# Patient Record
Sex: Male | Born: 1960 | Race: White | Hispanic: No | Marital: Married | Smoking: Current every day smoker
Health system: Southern US, Community
[De-identification: ages and names within clinical notes are randomized; demographics above are authoritative.]

## PROBLEM LIST (undated history)

## (undated) DIAGNOSIS — N400 Enlarged prostate without lower urinary tract symptoms: Secondary | ICD-10-CM

## (undated) DIAGNOSIS — K219 Gastro-esophageal reflux disease without esophagitis: Secondary | ICD-10-CM

## (undated) DIAGNOSIS — R350 Frequency of micturition: Secondary | ICD-10-CM

## (undated) DIAGNOSIS — B1920 Unspecified viral hepatitis C without hepatic coma: Secondary | ICD-10-CM

## (undated) DIAGNOSIS — I10 Essential (primary) hypertension: Secondary | ICD-10-CM

## (undated) DIAGNOSIS — Z973 Presence of spectacles and contact lenses: Secondary | ICD-10-CM

## (undated) HISTORY — PX: TYMPANIC MEMBRANE REPAIR: SHX294

## (undated) HISTORY — PX: APPENDECTOMY: SHX54

## (undated) HISTORY — PX: COLONOSCOPY: SHX174

---

## 1998-11-01 ENCOUNTER — Ambulatory Visit (HOSPITAL_COMMUNITY): Admission: RE | Admit: 1998-11-01 | Discharge: 1998-11-01 | Payer: Self-pay | Admitting: Gastroenterology

## 1998-11-04 ENCOUNTER — Ambulatory Visit (HOSPITAL_COMMUNITY): Admission: RE | Admit: 1998-11-04 | Discharge: 1998-11-04 | Payer: Self-pay | Admitting: Gastroenterology

## 1998-11-04 ENCOUNTER — Encounter: Payer: Self-pay | Admitting: Gastroenterology

## 1998-11-15 ENCOUNTER — Ambulatory Visit (HOSPITAL_COMMUNITY): Admission: RE | Admit: 1998-11-15 | Discharge: 1998-11-15 | Payer: Self-pay | Admitting: Gastroenterology

## 1999-06-27 ENCOUNTER — Other Ambulatory Visit: Admission: RE | Admit: 1999-06-27 | Discharge: 1999-07-07 | Payer: Self-pay | Admitting: Psychiatry

## 1999-12-13 ENCOUNTER — Emergency Department (HOSPITAL_COMMUNITY): Admission: EM | Admit: 1999-12-13 | Discharge: 1999-12-13 | Payer: Self-pay | Admitting: Emergency Medicine

## 1999-12-13 ENCOUNTER — Encounter: Payer: Self-pay | Admitting: Emergency Medicine

## 2004-04-11 ENCOUNTER — Ambulatory Visit (HOSPITAL_COMMUNITY): Admission: RE | Admit: 2004-04-11 | Discharge: 2004-04-11 | Payer: Self-pay | Admitting: Family Medicine

## 2005-08-15 ENCOUNTER — Emergency Department (HOSPITAL_COMMUNITY): Admission: EM | Admit: 2005-08-15 | Discharge: 2005-08-15 | Payer: Self-pay | Admitting: Emergency Medicine

## 2008-06-07 ENCOUNTER — Observation Stay (HOSPITAL_COMMUNITY): Admission: EM | Admit: 2008-06-07 | Discharge: 2008-06-08 | Payer: Self-pay | Admitting: Emergency Medicine

## 2009-12-12 IMAGING — CR DG CHEST 1V PORT
1 series · 1 of 1 positions shown · non-contrast
Comparison: None available

CLINICAL DATA: Chest pain

PORTABLE CHEST - 1 VIEW

[view not recorded]
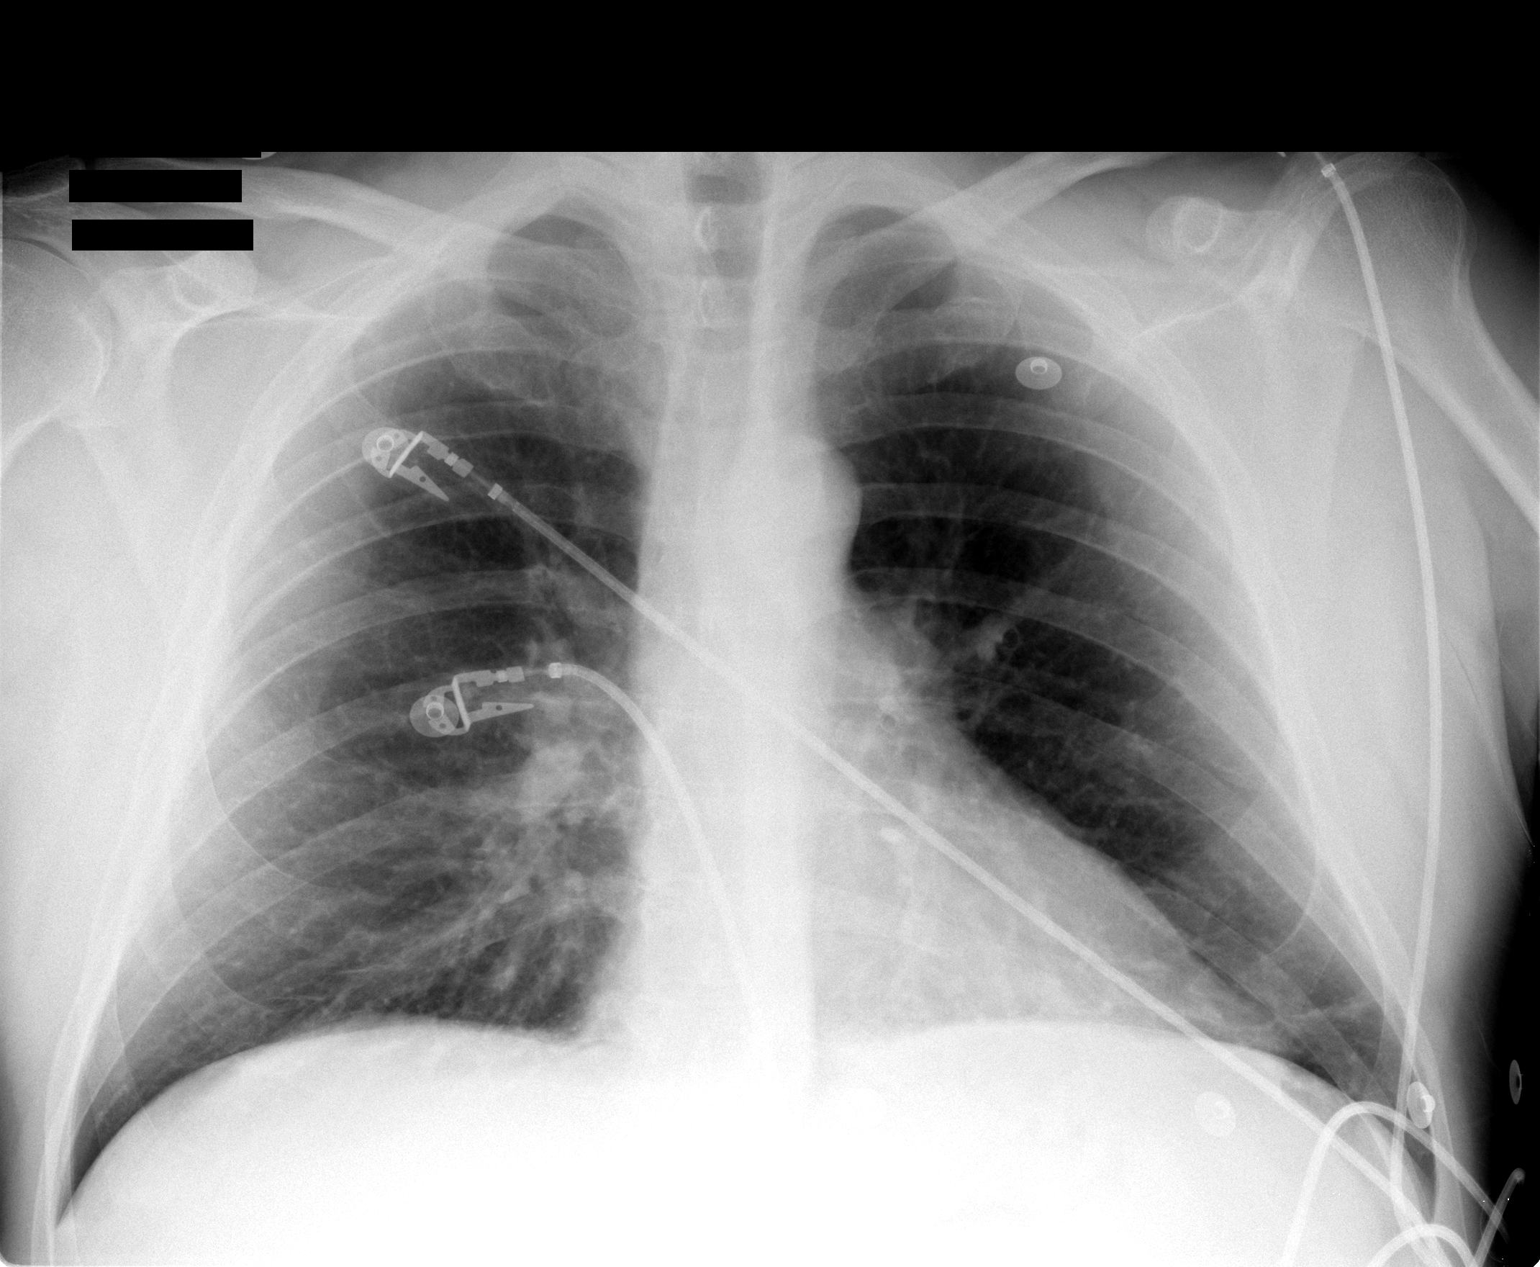

[1 of 1 positions shown; findings below may reference images not displayed]

FINDINGS: All minimal linear scarring or atelectasis laterally at
the left lung base.  Lungs otherwise clear.  Heart size normal.  No
effusion.  Visualized bones unremarkable.
IMPRESSION: 1.  Minimal linear scarring or atelectasis at the left lung base

## 2009-12-12 IMAGING — CT CT ANGIO CHEST
2 of 9 series · 12 of 36 positions shown · IV contrast (omnipaque)
Comparison: No comparison chest CT.  Prior abdominal CT 04/11/2004.

CLINICAL DATA: CHEST PAIN.  RULE OUT DISSECTION.

CT ANGIOGRAPHY CHEST
TECHNIQUE: Multidetector CT imaging of the chest was performed
using the standard protocol during bolus administration of
intravenous contrast. Multiplanar CT image reconstructions were
obtained to evaluate the vascular anatomy.
Contrast: 100 ml Omnipaque 350

[Series 5: aortic dissection · axial · 0.80mm/px · z∈[-524,-110]mm · 11 of 200 slices shown]
[im 17/200  lung]
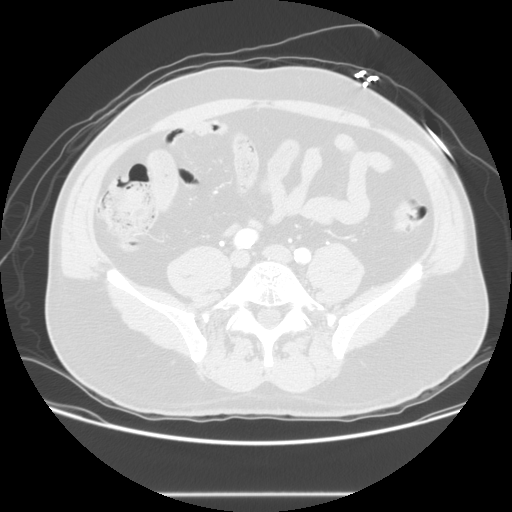
[im 34/200  mediastinal]
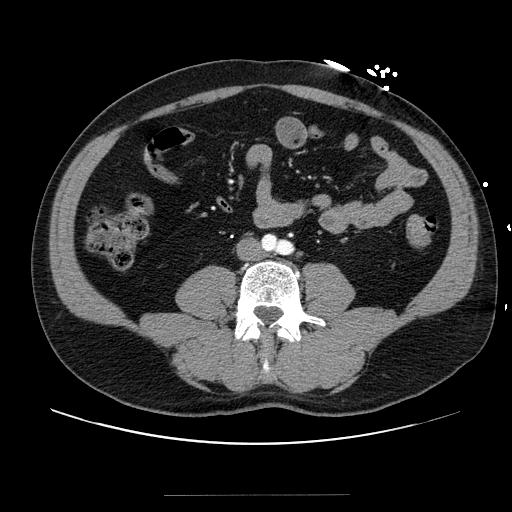
[im 50/200  lung]
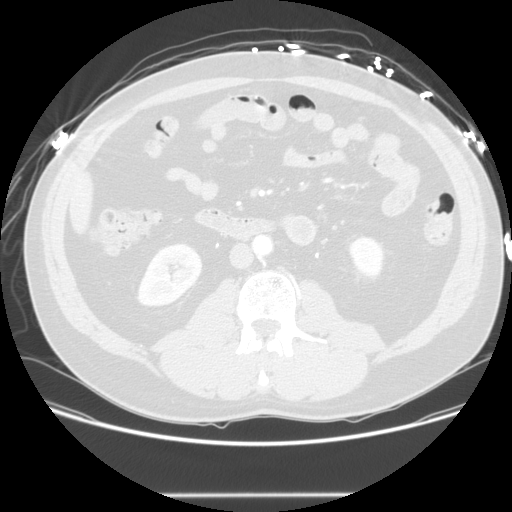
[im 67/200  mediastinal]
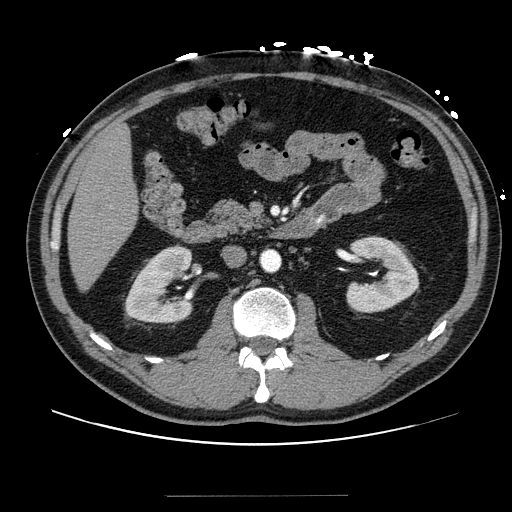
[im 83/200  lung]
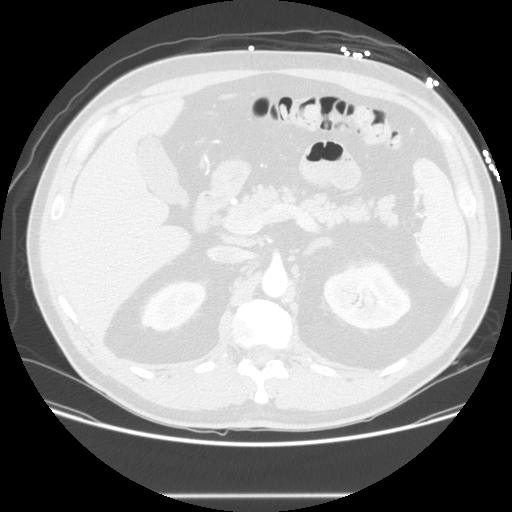
[im 100/200  mediastinal]
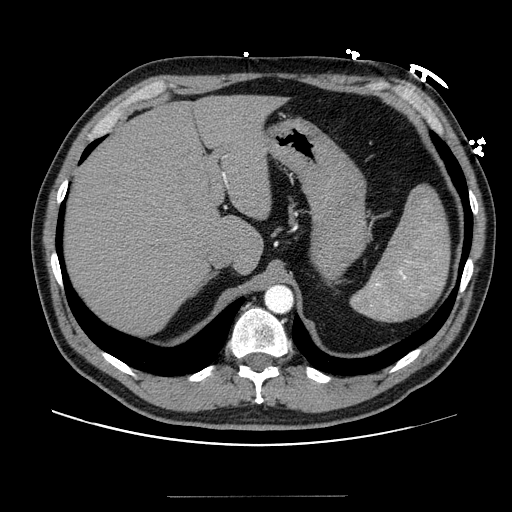
[im 117/200  lung]
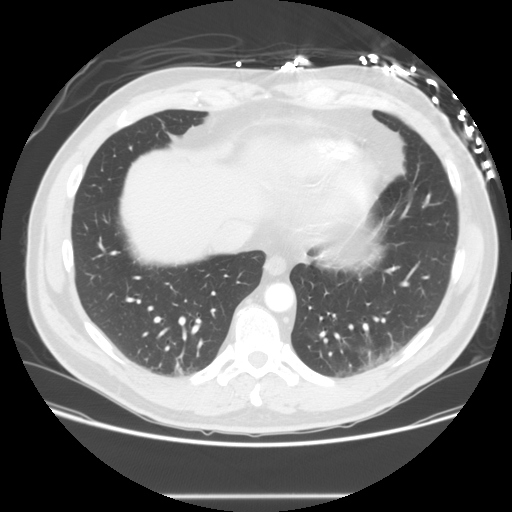
[im 133/200  mediastinal]
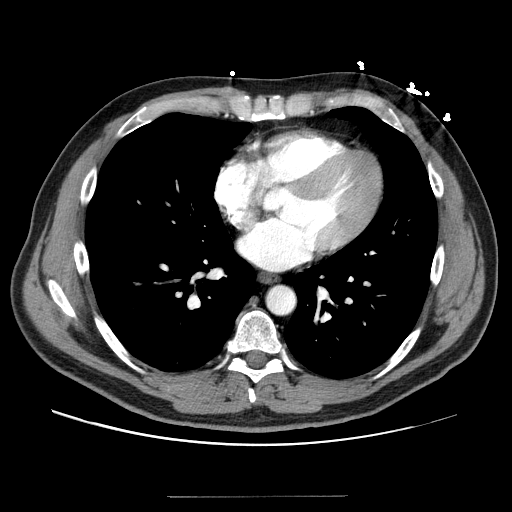
[im 150/200  lung]
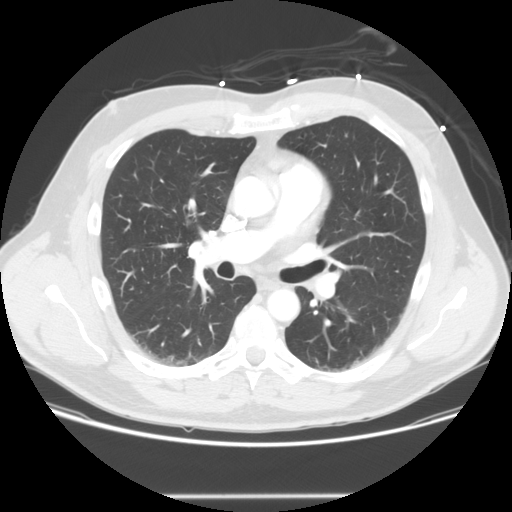
[im 166/200  mediastinal]
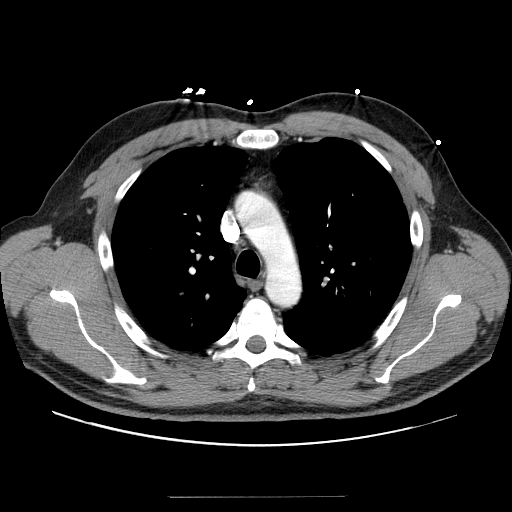
[im 183/200  lung]
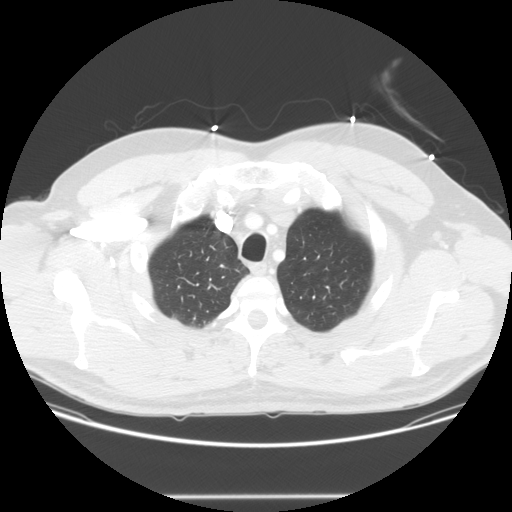

[Series 703: reformatted · coronal · 0.90mm/px · 1 of 112 slices shown]
[im 56/112  mediastinal]
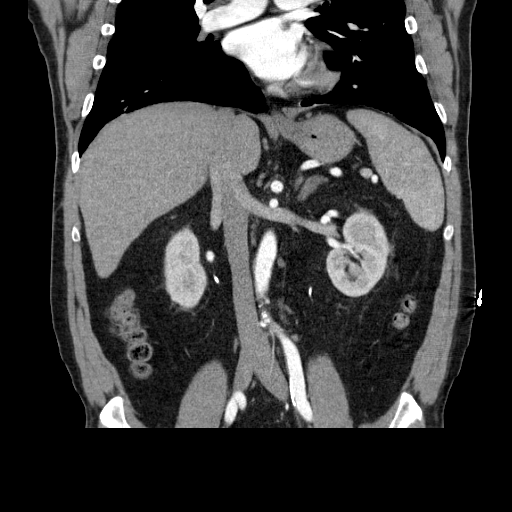

[12 of 36 positions shown; findings below may reference images not displayed]

FINDINGS: No aortic dissection.  Left vertebral artery arises
directly from the aortic arch.  No obvious pulmonary embolus.
Small calcification left anterior descending coronary artery.
Basilar subsegmental atelectatic changes.  No mediastinal or hilar
adenopathy.  Mild degenerative changes thoracic spine without bony
destructive lesion.  No pericardial effusion.
IMPRESSION: No aortic dissection.

Tiny calcification left anterior descending coronary artery.

Bibasilar subsegmental atelectatic changes.

CT angiogram of the abdomen:
FINDINGS: No abdominal aortic dissection.  Scattered plaque lower
abdominal aorta and common iliac arteries.  No aneurysmal
dilatation of the abdominal aorta..  Two dominant right renal
arteries.  No significant narrowing celiac axis or superior
mesenteric artery.

Arterial phase imaging of the liver, spleen, adrenal glands,
kidneys and pancreas unremarkable.  No calcified gallstones.  No
obvious upper abdominal abnormal fluid collection or inflammatory
process.  Mild degenerative changes
IMPRESSION: Mild atherosclerotic type changes lower abdominal aorta and common
iliac arteries without evidence of aneurysmal dilatation or
dissection.

## 2011-02-06 NOTE — Discharge Summary (Signed)
NAMELEANDREW, KEECH                   ACCOUNT NO.:  [id number]   MEDICAL RECORD NO.:  [id number]          PATIENT TYPE:  OBV   LOCATION:  2919                         FACILITY:  [facility]   PHYSICIAN:  Armanda Magic, M.D.     DATE OF BIRTH:  [birth year]   DATE OF ADMISSION:  06/07/2008  DATE OF DISCHARGE:  06/08/2008                               DISCHARGE SUMMARY   DISCHARGE DIAGNOSES:  1. Chest pain, atypical, felt to be noncardiac in nature.  2. Dyslipidemia with low HDL.  3. History of hepatitis C status post interferon.  4. Hypertension.   HOSPITAL COURSE:  Dean Davis is a 50 year old male patient who was  admitted to [facility] on June 07, 2008, with severe substernal  chest pain.   Lab studies showed normal cardiac isoenzymes.  Other lab studies showed  a total cholesterol of 165, triglycerides 185, HDL 30, and LDL 98.  A CT  angiogram of the chest showed no aneurysmal dilatation or dissection.   EKG showed normal sinus rhythm with no ST-T wave changes.   The patient was kept in the hospital overnight and underwent a nuclear  stress test.  The stress test showed no ischemia, EF 64%, essentially  normal.  The patient was discharged to home in stable but improved  condition.  The patient is to go home on the following medications:  1. Nexium 40 mg 1 tablet daily.  2. Bisoprolol/HCTZ 5/6.25 mg 1 tablet daily.   Remain on a low-sodium, heart-healthy diet.  No restrictions on  activity.  Follow with Dr. Carolanne Grumbling in 2 weeks.  Office will call  about appointment.      Guy Franco, P.A.      Armanda Magic, M.D.  Electronically Signed    LB/MEDQ  D:  06/22/2008  T:  06/23/2008  Job:  [id number]

## 2011-06-22 LAB — POCT CARDIAC MARKERS
CKMB, poc: 1
CKMB, poc: 1.3
Myoglobin, poc: 45.4
Myoglobin, poc: 45.9
Myoglobin, poc: 66.4

## 2011-06-22 LAB — DIFFERENTIAL
Basophils Relative: 0
Lymphs Abs: 3.3
Monocytes Relative: 5
Neutro Abs: 8.1 — ABNORMAL HIGH

## 2011-06-22 LAB — CK TOTAL AND CKMB (NOT AT ARMC)
CK, MB: 1.8
CK, MB: 1.8
Total CK: 86
Total CK: 97

## 2011-06-22 LAB — COMPREHENSIVE METABOLIC PANEL
AST: 28
Albumin: 4.1
Alkaline Phosphatase: 53
Creatinine, Ser: 0.99
GFR calc non Af Amer: >60
Glucose, Bld: 107 — ABNORMAL HIGH
Potassium: 4.5
Total Protein: 7.2

## 2011-06-22 LAB — CARDIAC PANEL(CRET KIN+CKTOT+MB+TROPI)
CK, MB: 1.6
Relative Index: INVALID

## 2011-06-22 LAB — CBC
Hemoglobin: 14.2
Hemoglobin: 16.9
MCHC: 33.3
MCV: 92.7
MCV: 93.4
RBC: 5.43
RDW: 14
WBC: 12.2 — ABNORMAL HIGH
WBC: 14.1 — ABNORMAL HIGH

## 2011-06-22 LAB — D-DIMER, QUANTITATIVE: D-Dimer, Quant: 0.43

## 2011-06-22 LAB — LIPID PANEL
Cholesterol: 165
HDL: 30 — ABNORMAL LOW
Triglycerides: 185 — ABNORMAL HIGH

## 2011-06-22 LAB — POCT I-STAT, CHEM 8
Creatinine, Ser: 1.1
Hemoglobin: 17.7 — ABNORMAL HIGH
Sodium: 141

## 2011-06-22 LAB — MAGNESIUM: Magnesium: 2.3

## 2011-06-22 LAB — HEPARIN LEVEL (UNFRACTIONATED)
Heparin Unfractionated: 0.66
Heparin Unfractionated: 0.9 — ABNORMAL HIGH

## 2011-06-22 LAB — TROPONIN I: Troponin I: <0.01

## 2011-06-22 LAB — APTT: aPTT: 30

## 2013-12-24 ENCOUNTER — Emergency Department (HOSPITAL_COMMUNITY)
Admission: EM | Admit: 2013-12-24 | Discharge: 2013-12-24 | Disposition: A | Discharge status: Home / Self Care | Payer: 59 | Attending: Emergency Medicine | Admitting: Emergency Medicine

## 2013-12-24 ENCOUNTER — Encounter (HOSPITAL_COMMUNITY): Payer: Self-pay | Admitting: Emergency Medicine

## 2013-12-24 DIAGNOSIS — R339 Retention of urine, unspecified: Secondary | ICD-10-CM | POA: Insufficient documentation

## 2013-12-24 DIAGNOSIS — I10 Essential (primary) hypertension: Secondary | ICD-10-CM | POA: Insufficient documentation

## 2013-12-24 DIAGNOSIS — Z8619 Personal history of other infectious and parasitic diseases: Secondary | ICD-10-CM | POA: Insufficient documentation

## 2013-12-24 DIAGNOSIS — Z79899 Other long term (current) drug therapy: Secondary | ICD-10-CM | POA: Insufficient documentation

## 2013-12-24 DIAGNOSIS — R3989 Other symptoms and signs involving the genitourinary system: Secondary | ICD-10-CM | POA: Insufficient documentation

## 2013-12-24 DIAGNOSIS — R350 Frequency of micturition: Secondary | ICD-10-CM | POA: Insufficient documentation

## 2013-12-24 DIAGNOSIS — F17200 Nicotine dependence, unspecified, uncomplicated: Secondary | ICD-10-CM | POA: Insufficient documentation

## 2013-12-24 DIAGNOSIS — Z87448 Personal history of other diseases of urinary system: Secondary | ICD-10-CM | POA: Insufficient documentation

## 2013-12-24 DIAGNOSIS — R3915 Urgency of urination: Secondary | ICD-10-CM | POA: Insufficient documentation

## 2013-12-24 DIAGNOSIS — R109 Unspecified abdominal pain: Secondary | ICD-10-CM | POA: Insufficient documentation

## 2013-12-24 HISTORY — DX: Essential (primary) hypertension: I10

## 2013-12-24 HISTORY — DX: Unspecified viral hepatitis C without hepatic coma: B19.20

## 2013-12-24 LAB — URINALYSIS, ROUTINE W REFLEX MICROSCOPIC
Bilirubin Urine: NEGATIVE
Glucose, UA: NEGATIVE mg/dL
Hgb urine dipstick: NEGATIVE
Ketones, ur: NEGATIVE mg/dL
LEUKOCYTES UA: NEGATIVE
NITRITE: NEGATIVE
PH: 7 (ref 5.0–8.0)
Protein, ur: NEGATIVE mg/dL
SPECIFIC GRAVITY, URINE: 1.012 (ref 1.005–1.030)
Urobilinogen, UA: 0.2 mg/dL (ref 0.0–1.0)

## 2013-12-24 LAB — COMPREHENSIVE METABOLIC PANEL
ALT: 94 U/L — ABNORMAL HIGH (ref 0–53)
AST: 54 U/L — ABNORMAL HIGH (ref 0–37)
Albumin: 3.9 g/dL (ref 3.5–5.2)
Alkaline Phosphatase: 66 U/L (ref 39–117)
BUN: 14 mg/dL (ref 6–23)
CALCIUM: 9.6 mg/dL (ref 8.4–10.5)
CO2: 21 mEq/L (ref 19–32)
Chloride: 101 mEq/L (ref 96–112)
Creatinine, Ser: 0.96 mg/dL (ref 0.50–1.35)
GFR calc non Af Amer: >90 mL/min (ref >90)
GLUCOSE: 129 mg/dL — AB (ref 70–99)
Potassium: 4.3 mEq/L (ref 3.7–5.3)
SODIUM: 138 meq/L (ref 137–147)
TOTAL PROTEIN: 7.5 g/dL (ref 6.0–8.3)
Total Bilirubin: 0.4 mg/dL (ref 0.3–1.2)

## 2013-12-24 LAB — CBC WITH DIFFERENTIAL/PLATELET
BASOS PCT: 0 % (ref 0–1)
Basophils Absolute: 0 10*3/uL (ref 0.0–0.1)
EOS ABS: 0.1 10*3/uL (ref 0.0–0.7)
EOS PCT: 1 % (ref 0–5)
HCT: 45.8 % (ref 39.0–52.0)
Hemoglobin: 15.5 g/dL (ref 13.0–17.0)
LYMPHS ABS: 1.8 10*3/uL (ref 0.7–4.0)
Lymphocytes Relative: 20 % (ref 12–46)
MCH: 30.9 pg (ref 26.0–34.0)
MCHC: 33.8 g/dL (ref 30.0–36.0)
MCV: 91.2 fL (ref 78.0–100.0)
MONOS PCT: 7 % (ref 3–12)
Monocytes Absolute: 0.7 10*3/uL (ref 0.1–1.0)
Neutro Abs: 6.4 10*3/uL (ref 1.7–7.7)
Neutrophils Relative %: 71 % (ref 43–77)
PLATELETS: 204 10*3/uL (ref 150–400)
RBC: 5.02 MIL/uL (ref 4.22–5.81)
RDW: 14.7 % (ref 11.5–15.5)
WBC: 8.9 10*3/uL (ref 4.0–10.5)

## 2013-12-24 MED ORDER — TAMSULOSIN HCL 0.4 MG PO CAPS: 0.4000 mg | ORAL_CAPSULE | Freq: Every day | ORAL | Status: AC

## 2013-12-24 MED ORDER — LIDOCAINE HCL 2 % EX GEL
CUTANEOUS | Status: AC
  Administered 2013-12-24: 10
  Filled 2013-12-24: qty 10

## 2013-12-24 NOTE — ED Notes (Signed)
Pt from home via EMS c/o sudden onset bladder pain that radiates to groin. Pt states that when he tries to urinate, it's "only a trickle." Pt received 200 mcg Fentanyl en route w/o relief. Pt rates 10/10 pain. Pt is A&O and in NAD

## 2013-12-24 NOTE — Discharge Instructions (Signed)
Acute Urinary Retention, Male Follow up with the urologist this week. Return to the ED if you develop new or worsening symptoms. Acute urinary retention is the temporary inability to urinate. This is a common problem in older men. As men age their prostates become larger and block the flow of urine from the bladder. This is usually a problem that has come on gradually.  HOME CARE INSTRUCTIONS If you are sent home with a Foley catheter and a drainage system, you will need to discuss the best course of action with your health care provider. While the catheter is in, maintain a good intake of fluids. Keep the drainage bag emptied and lower than your catheter. This is so that contaminated urine will not flow back into your bladder, which could lead to a urinary tract infection. There are two main types of drainage bags. One is a large bag that usually is used at night. It has a good capacity that will allow you to sleep through the night without having to empty it. The second type is called a leg bag. It has a smaller capacity, so it needs to be emptied more frequently. However, the main advantage is that it can be attached by a leg strap and can go underneath your clothing, allowing you the freedom to move about or leave your home. Only take over-the-counter or prescription medicines for pain, discomfort, or fever as directed by your health care provider.  SEEK MEDICAL CARE IF:  You develop a low-grade fever.  You experience spasms or leakage of urine with the spasms. SEEK IMMEDIATE MEDICAL CARE IF:   You develop chills or fever.  Your catheter stops draining urine.  Your catheter falls out.  You start to develop increased bleeding that does not respond to rest and increased fluid intake. MAKE SURE YOU:  Understand these instructions.  Will watch your condition.  Will get help right away if you are not doing well or get worse. Document Released: 12/14/2000 Document Revised: 05/10/2013  Document Reviewed: 02/16/2013 [facility] Patient Information 2014 [employer] [place].

## 2013-12-24 NOTE — ED Provider Notes (Signed)
CSN: [id number]     Arrival date & time 12/24/13  1338 History   First MD Initiated Contact with Patient 12/24/13 1403     Chief Complaint  Patient presents with  . Urinary Retention     (Consider location/radiation/quality/duration/timing/severity/associated sxs/prior Treatment) HPI Comments: Patient extremely uncomfortable complaining of inability to urinate since last night. No previous history of same. Patient says he often has urinary frequency and urgency and thinks he enlarged prostate. Denies any fever, nausea or vomiting. No chest pain or shortness of breath. He received fentanyl by EMS without relief.  The history is provided by the patient and the EMS personnel. The history is limited by the condition of the patient.    Past Medical History  Diagnosis Date  . Hypertension   . Hepatitis C    Past Surgical History  Procedure Laterality Date  . Appendectomy     No family history on file. History  Substance Use Topics  . Smoking status: Current Every Day Smoker -- 1.00 packs/day    Types: Cigarettes  . Smokeless tobacco: Not on file  . Alcohol Use: Yes     Comment: socially    Review of Systems  Constitutional: Negative for activity change and appetite change.  HENT: Negative for congestion.   Respiratory: Negative for chest tightness.   Cardiovascular: Negative for chest pain.  Gastrointestinal: Positive for abdominal pain. Negative for nausea and vomiting.  Genitourinary: Positive for urgency, frequency and difficulty urinating. Negative for flank pain.  Musculoskeletal: Negative for back pain.  Skin: Negative for rash.  Neurological: Negative for dizziness, weakness and headaches.  A complete 10 system review of systems was obtained and all systems are negative except as noted in the HPI and PMH.      Allergies  Review of patient's allergies indicates no known allergies.  Home Medications   Current Outpatient Rx  Name  Route  Sig  Dispense  Refill  .  amLODipine (NORVASC) 5 MG tablet   Oral   Take 5 mg by mouth at bedtime.         . diphenhydrAMINE (SOMINEX) 25 MG tablet   Oral   Take 25 mg by mouth at bedtime as needed for sleep.         . tamsulosin (FLOMAX) 0.4 MG CAPS capsule   Oral   Take 1 capsule (0.4 mg total) by mouth daily.   30 capsule   0    BP 118/77  Pulse 93  Temp(Src) 97.5 F (36.4 C) (Oral)  Resp 18  SpO2 100% Physical Exam  Constitutional: He is oriented to person, place, and time. He appears well-developed and well-nourished. He appears distressed.  uncomfortable  HENT:  Head: Normocephalic and atraumatic.  Right Ear: External ear normal.  Left Ear: External ear normal.  Mouth/Throat: Oropharynx is clear and moist. No oropharyngeal exudate.  Eyes: Conjunctivae and EOM are normal. Pupils are equal, round, and reactive to light.  Neck: Normal range of motion. Neck supple.  Cardiovascular: Normal rate, regular rhythm and normal heart sounds.   No murmur heard. Pulmonary/Chest: Effort normal and breath sounds normal. No respiratory distress.  Abdominal: Soft. There is tenderness. There is no rebound and no guarding.  Enlarged tender bladder  Genitourinary:  No testicular tenderness  Musculoskeletal: Normal range of motion. He exhibits no edema and no tenderness.  Neurological: He is alert and oriented to person, place, and time. No cranial nerve deficit. He exhibits normal muscle tone. Coordination normal.  Skin: Skin is  warm.    ED Course  Procedures (including critical care time) Labs Review Labs Reviewed  COMPREHENSIVE METABOLIC PANEL - Abnormal; Notable for the following:    Glucose, Bld 129 (*)    AST 54 (*)    ALT 94 (*)    All other components within normal limits  CBC WITH DIFFERENTIAL  URINALYSIS, ROUTINE W REFLEX MICROSCOPIC   Imaging Review No results found.   EKG Interpretation None      MDM   Final diagnoses:  Urinary retention   Difficulty urinating and bladder  pain since this morning. History of self diagnosed prostate problems in the past. No nausea, vomiting or fever.  Foley catheter placed with 700 cc of clear urine drained. Patient feels 100% better. No abdominal pain. Creatinine wnl.  Mild LFT elevation with history of hepatitis C.  No RUQ pain.  Urinalysis is negative. Will start Flomax for perceived prostate issues. Followup with urology this week. Return precautions discussed.  Glynn OctaveStephen Hagop Mccollam, MD 12/24/13 1534

## 2013-12-24 NOTE — ED Notes (Signed)
Pt bladder scan results >200 ml

## 2013-12-24 NOTE — ED Notes (Signed)
Pt from home c/o "extreme pressure and pain in my bladder." Pt denies that this has happened before, but states that he has had urinary frequency and urgency for years and has self-diagnosed himself with enlarged prostate. Pt denies N/V/D, fever. Pt lung fields clear and reports 10/10 pain. Pt is A&O and in NAD

## 2013-12-24 NOTE — ED Notes (Signed)
Bed: [id number] Expected date: 12/24/13 Expected time:  Means of arrival:  Comments: EMS-Difficulty Urinating

## 2014-09-06 ENCOUNTER — Encounter (HOSPITAL_BASED_OUTPATIENT_CLINIC_OR_DEPARTMENT_OTHER): Payer: Self-pay | Admitting: *Deleted

## 2014-09-06 NOTE — Progress Notes (Signed)
   09/06/14 1435  OBSTRUCTIVE SLEEP APNEA  Have you ever been diagnosed with sleep apnea through a sleep study? No  Do you snore loudly (loud enough to be heard through closed doors)?  1  Do you often feel tired, fatigued, or sleepy during the daytime? 0  Has anyone observed you stop breathing during your sleep? 0  Do you have, or are you being treated for high blood pressure? 1  BMI more than 35 kg/m2? 0  Age over 54 years old? 1  Gender: 1  Obstructive Sleep Apnea Score 4  Score 4 or greater  Results sent to PCP   

## 2014-09-06 NOTE — Progress Notes (Signed)
To come in for ekg-bmet Sees different dr-did see urologist after er visit 4/15 for retention=-now on flomax Did see dr Tenny Crawross 09 for cp-stress test negative-started on acid reflux meds

## 2014-09-06 NOTE — Progress Notes (Signed)
   09/06/14 1435  OBSTRUCTIVE SLEEP APNEA  Have you ever been diagnosed with sleep apnea through a sleep study? No  Do you snore loudly (loud enough to be heard through closed doors)?  1  Do you often feel tired, fatigued, or sleepy during the daytime? 0  Has anyone observed you stop breathing during your sleep? 0  Do you have, or are you being treated for high blood pressure? 1  BMI more than 35 kg/m2? 0  Age over 54 years old? 1  Gender: 1  Obstructive Sleep Apnea Score 4  Score 4 or greater  Results sent to PCP

## 2014-09-07 ENCOUNTER — Encounter (HOSPITAL_BASED_OUTPATIENT_CLINIC_OR_DEPARTMENT_OTHER)
Admission: RE | Admit: 2014-09-07 | Discharge: 2014-09-07 | Disposition: A | Discharge status: Home / Self Care | Payer: 59 | Source: Ambulatory Visit | Attending: Orthopedic Surgery | Admitting: Orthopedic Surgery

## 2014-09-07 DIAGNOSIS — I10 Essential (primary) hypertension: Secondary | ICD-10-CM | POA: Diagnosis not present

## 2014-09-07 DIAGNOSIS — Z01818 Encounter for other preprocedural examination: Secondary | ICD-10-CM | POA: Insufficient documentation

## 2014-09-07 DIAGNOSIS — S8262XA Displaced fracture of lateral malleolus of left fibula, initial encounter for closed fracture: Secondary | ICD-10-CM | POA: Diagnosis present

## 2014-09-07 DIAGNOSIS — K219 Gastro-esophageal reflux disease without esophagitis: Secondary | ICD-10-CM | POA: Diagnosis not present

## 2014-09-07 DIAGNOSIS — B1920 Unspecified viral hepatitis C without hepatic coma: Secondary | ICD-10-CM | POA: Diagnosis not present

## 2014-09-07 DIAGNOSIS — Y939 Activity, unspecified: Secondary | ICD-10-CM | POA: Diagnosis not present

## 2014-09-07 DIAGNOSIS — X58XXXA Exposure to other specified factors, initial encounter: Secondary | ICD-10-CM | POA: Diagnosis not present

## 2014-09-07 DIAGNOSIS — Y999 Unspecified external cause status: Secondary | ICD-10-CM | POA: Diagnosis not present

## 2014-09-07 DIAGNOSIS — Y929 Unspecified place or not applicable: Secondary | ICD-10-CM | POA: Diagnosis not present

## 2014-09-07 DIAGNOSIS — F17200 Nicotine dependence, unspecified, uncomplicated: Secondary | ICD-10-CM | POA: Diagnosis not present

## 2014-09-07 LAB — BASIC METABOLIC PANEL
ANION GAP: 13 (ref 5–15)
BUN: 19 mg/dL (ref 6–23)
CHLORIDE: 101 meq/L (ref 96–112)
CO2: 23 mEq/L (ref 19–32)
CREATININE: 0.8 mg/dL (ref 0.50–1.35)
Calcium: 9.2 mg/dL (ref 8.4–10.5)
GFR calc non Af Amer: >90 mL/min (ref >90)
Glucose, Bld: 86 mg/dL (ref 70–99)
POTASSIUM: 4.5 meq/L (ref 3.7–5.3)
SODIUM: 137 meq/L (ref 137–147)

## 2014-09-07 NOTE — Progress Notes (Signed)
ekg reviewed by dr Jacklynn Buemassagee no change from last

## 2014-09-07 NOTE — H&P (Signed)
  [facility] [address]   [address] [place], [place] [phone] A Division [facility]  Loreta Aveaniel F. Archibald Marchetta, M.D.   Robert A. Thurston HoleWainer, M.D.   Burnell BlanksW. Dan Caffrey, M.D.   Eulas PostJoshua P. Landau, M.D.   Lunette StandsAnna Voytek, M.D Jewel Baizeimothy D. Eulah PontMurphy, M.D.  Buford DresserWesley R. Ibazebo, M.D.  Estell HarpinJames S. Kramer, M.D.    Melina Fiddlerebecca S. Bassett, M.D. Mary L. Isidoro DonningAnton, PA-C  Kirstin A. Shepperson, PA-C  Josh Gilbertshadwell, PA-C [place]andon Parry, [place]A-C  RE: Dean FuseMahl, Dean   [phone]      DOB: 06/21/1961 PROGRESS NOTE: 09-03-14 Dean Davis is a 53 year old seen for follow-up from his left ankle distal fibula displaced fracture with significant post fracture swelling. He was scheduled for surgery in 2 days and we needed to check him today because of his swelling.  EXAMINATION: Well-developed well-nourished white male in no acute distress. Alert and oriented. Examination of his left ankle reveals a large amount of swelling, there is significant erythema and ecchymosis medially. He has a small wound anteromedially that is healing he has significant ecchymosis proximally as well.    X-RAYS: Left ankle AP lateral and oblique show persistence of the distal fibula fracture with medial joint space widening.  IMPRESSION: 10 days status post left ankle displaced distal fibula oblique fracture.  PLAN: At this point we will hold off on surgery. I'm going to have him see Dr. Renaye Rakersim Lejla Moeser in 4 days for wound check and have Dr. Eulah PontMurphy proceed with open reduction internal fixation of the ankle next week. We will set him up for this. We also place him on Keflex 500 mg. q.i.d.   Robert A. Thurston HoleWainer, M.D.   Electronically verified by Elana Almobert A. Thurston HoleWainer, M.D. RAW:kah D 09-03-14 T 09-04-14

## 2014-09-11 ENCOUNTER — Ambulatory Visit (HOSPITAL_BASED_OUTPATIENT_CLINIC_OR_DEPARTMENT_OTHER): Payer: 59 | Admitting: Anesthesiology

## 2014-09-11 ENCOUNTER — Encounter (HOSPITAL_BASED_OUTPATIENT_CLINIC_OR_DEPARTMENT_OTHER): Payer: Self-pay

## 2014-09-11 ENCOUNTER — Ambulatory Visit (HOSPITAL_BASED_OUTPATIENT_CLINIC_OR_DEPARTMENT_OTHER)
Admission: RE | Admit: 2014-09-11 | Discharge: 2014-09-11 | Disposition: A | Discharge status: Home / Self Care | Payer: 59 | Source: Ambulatory Visit | Attending: Orthopedic Surgery | Admitting: Orthopedic Surgery

## 2014-09-11 ENCOUNTER — Encounter (HOSPITAL_BASED_OUTPATIENT_CLINIC_OR_DEPARTMENT_OTHER)
Admission: RE | Disposition: A | Discharge status: Home / Self Care | Payer: Self-pay | Source: Ambulatory Visit | Attending: Orthopedic Surgery

## 2014-09-11 DIAGNOSIS — Y929 Unspecified place or not applicable: Secondary | ICD-10-CM | POA: Insufficient documentation

## 2014-09-11 DIAGNOSIS — S8262XA Displaced fracture of lateral malleolus of left fibula, initial encounter for closed fracture: Secondary | ICD-10-CM | POA: Insufficient documentation

## 2014-09-11 DIAGNOSIS — K219 Gastro-esophageal reflux disease without esophagitis: Secondary | ICD-10-CM | POA: Insufficient documentation

## 2014-09-11 DIAGNOSIS — F17200 Nicotine dependence, unspecified, uncomplicated: Secondary | ICD-10-CM | POA: Insufficient documentation

## 2014-09-11 DIAGNOSIS — B1920 Unspecified viral hepatitis C without hepatic coma: Secondary | ICD-10-CM | POA: Insufficient documentation

## 2014-09-11 DIAGNOSIS — X58XXXA Exposure to other specified factors, initial encounter: Secondary | ICD-10-CM | POA: Insufficient documentation

## 2014-09-11 DIAGNOSIS — I10 Essential (primary) hypertension: Secondary | ICD-10-CM | POA: Insufficient documentation

## 2014-09-11 DIAGNOSIS — Y999 Unspecified external cause status: Secondary | ICD-10-CM | POA: Insufficient documentation

## 2014-09-11 DIAGNOSIS — Y939 Activity, unspecified: Secondary | ICD-10-CM | POA: Insufficient documentation

## 2014-09-11 HISTORY — DX: Benign prostatic hyperplasia without lower urinary tract symptoms: N40.0

## 2014-09-11 HISTORY — DX: Presence of spectacles and contact lenses: Z97.3

## 2014-09-11 HISTORY — PX: ORIF FIBULA FRACTURE: SHX5114

## 2014-09-11 HISTORY — DX: Gastro-esophageal reflux disease without esophagitis: K21.9

## 2014-09-11 HISTORY — DX: Frequency of micturition: R35.0

## 2014-09-11 LAB — POCT HEMOGLOBIN-HEMACUE: Hemoglobin: 15.5 g/dL (ref 13.0–17.0)

## 2014-09-11 SURGERY — OPEN REDUCTION INTERNAL FIXATION (ORIF) FIBULA FRACTURE
Anesthesia: General | Laterality: Left

## 2014-09-11 MED ORDER — DEXTROSE-NACL 5-0.45 % IV SOLN: 100.0000 mL/h | INTRAVENOUS | Status: DC

## 2014-09-11 MED ORDER — ACETAMINOPHEN 500 MG PO TABS: 1000.0000 mg | ORAL_TABLET | Freq: Once | ORAL | Status: DC

## 2014-09-11 MED ORDER — FENTANYL CITRATE 0.05 MG/ML IJ SOLN
50.0000 ug | INTRAMUSCULAR | Status: DC | PRN
  Administered 2014-09-11 (×2): 100 ug via INTRAVENOUS

## 2014-09-11 MED ORDER — ONDANSETRON HCL 4 MG PO TABS: 4.0000 mg | ORAL_TABLET | Freq: Three times a day (TID) | ORAL | Status: AC | PRN

## 2014-09-11 MED ORDER — LACTATED RINGERS IV SOLN
INTRAVENOUS | Status: DC
  Administered 2014-09-11 (×2): via INTRAVENOUS

## 2014-09-11 MED ORDER — OXYCODONE-ACETAMINOPHEN 5-325 MG PO TABS: 2.0000 | ORAL_TABLET | ORAL | Status: AC | PRN

## 2014-09-11 MED ORDER — BUPIVACAINE-EPINEPHRINE (PF) 0.5% -1:200000 IJ SOLN
INTRAMUSCULAR | Status: DC | PRN
  Administered 2014-09-11: 20 mL via PERINEURAL

## 2014-09-11 MED ORDER — CEFAZOLIN SODIUM-DEXTROSE 2-3 GM-% IV SOLR
2.0000 g | INTRAVENOUS | Status: AC
  Administered 2014-09-11: 2 g via INTRAVENOUS

## 2014-09-11 MED ORDER — 0.9 % SODIUM CHLORIDE (POUR BTL) OPTIME
TOPICAL | Status: DC | PRN
  Administered 2014-09-11: 200 mL

## 2014-09-11 MED ORDER — FENTANYL CITRATE 0.05 MG/ML IJ SOLN
INTRAMUSCULAR | Status: AC
  Filled 2014-09-11: qty 2

## 2014-09-11 MED ORDER — FENTANYL CITRATE 0.05 MG/ML IJ SOLN
INTRAMUSCULAR | Status: DC | PRN
  Administered 2014-09-11 (×2): 50 ug via INTRAVENOUS

## 2014-09-11 MED ORDER — ROPIVACAINE HCL 5 MG/ML IJ SOLN
INTRAMUSCULAR | Status: DC | PRN
  Administered 2014-09-11: 20 mL via PERINEURAL

## 2014-09-11 MED ORDER — DOCUSATE SODIUM 100 MG PO CAPS: 100.0000 mg | ORAL_CAPSULE | Freq: Two times a day (BID) | ORAL | Status: AC

## 2014-09-11 MED ORDER — ONDANSETRON HCL 4 MG/2ML IJ SOLN
INTRAMUSCULAR | Status: DC | PRN
  Administered 2014-09-11: 4 mg via INTRAVENOUS

## 2014-09-11 MED ORDER — MIDAZOLAM HCL 2 MG/2ML IJ SOLN
INTRAMUSCULAR | Status: AC
  Filled 2014-09-11: qty 2

## 2014-09-11 MED ORDER — CEFAZOLIN SODIUM-DEXTROSE 2-3 GM-% IV SOLR
INTRAVENOUS | Status: AC
  Filled 2014-09-11: qty 50

## 2014-09-11 MED ORDER — LIDOCAINE HCL (CARDIAC) 20 MG/ML IV SOLN
INTRAVENOUS | Status: DC | PRN
  Administered 2014-09-11: 80 mg via INTRAVENOUS

## 2014-09-11 MED ORDER — ONDANSETRON HCL 4 MG/2ML IJ SOLN: 4.0000 mg | Freq: Once | INTRAMUSCULAR | Status: DC | PRN

## 2014-09-11 MED ORDER — PROPOFOL 10 MG/ML IV BOLUS
INTRAVENOUS | Status: DC | PRN
  Administered 2014-09-11: 300 mg via INTRAVENOUS

## 2014-09-11 MED ORDER — MIDAZOLAM HCL 2 MG/2ML IJ SOLN
1.0000 mg | INTRAMUSCULAR | Status: DC | PRN
  Administered 2014-09-11 (×2): 2 mg via INTRAVENOUS

## 2014-09-11 MED ORDER — ASPIRIN 81 MG PO TABS: 81.0000 mg | ORAL_TABLET | Freq: Every day | ORAL | Status: AC

## 2014-09-11 MED ORDER — HYDROMORPHONE HCL 1 MG/ML IJ SOLN
0.2500 mg | INTRAMUSCULAR | Status: DC | PRN
  Administered 2014-09-11 (×2): 0.5 mg via INTRAVENOUS

## 2014-09-11 MED ORDER — DEXAMETHASONE SODIUM PHOSPHATE 10 MG/ML IJ SOLN
INTRAMUSCULAR | Status: DC | PRN
  Administered 2014-09-11: 10 mg via INTRAVENOUS

## 2014-09-11 MED ORDER — HYDROMORPHONE HCL 1 MG/ML IJ SOLN
INTRAMUSCULAR | Status: AC
  Filled 2014-09-11: qty 1

## 2014-09-11 SURGICAL SUPPLY — 75 items
BANDAGE ELASTIC 4 VELCRO ST LF (GAUZE/BANDAGES/DRESSINGS) ×3 IMPLANT
BANDAGE ELASTIC 6 VELCRO ST LF (GAUZE/BANDAGES/DRESSINGS) ×3 IMPLANT
BANDAGE ESMARK 6X9 LF (GAUZE/BANDAGES/DRESSINGS) ×1 IMPLANT
BIT DRILL 2.5X125 (BIT) ×2 IMPLANT
BIT DRILL 3.5X125 (BIT) IMPLANT
BLADE SURG 15 STRL LF DISP TIS (BLADE) ×2 IMPLANT
BLADE SURG 15 STRL SS (BLADE) ×3
BNDG CMPR 9X6 STRL LF SNTH (GAUZE/BANDAGES/DRESSINGS) ×1
BNDG COHESIVE 4X5 TAN STRL (GAUZE/BANDAGES/DRESSINGS) ×2 IMPLANT
BNDG ESMARK 6X9 LF (GAUZE/BANDAGES/DRESSINGS) ×3
CHLORAPREP W/TINT 26ML (MISCELLANEOUS) ×3 IMPLANT
CLOSURE STERI-STRIP 1/2X4 (GAUZE/BANDAGES/DRESSINGS) ×1
CLSR STERI-STRIP ANTIMIC 1/2X4 (GAUZE/BANDAGES/DRESSINGS) ×2 IMPLANT
COVER BACK TABLE 60X90IN (DRAPES) ×3 IMPLANT
COVER MAYO STAND STRL (DRAPES) ×3 IMPLANT
CUFF TOURNIQUET SINGLE 24IN (TOURNIQUET CUFF) ×2 IMPLANT
CUFF TOURNIQUET SINGLE 34IN LL (TOURNIQUET CUFF) IMPLANT
DECANTER SPIKE VIAL GLASS SM (MISCELLANEOUS) IMPLANT
DRAPE C-ARM 42X72 X-RAY (DRAPES) IMPLANT
DRAPE C-ARMOR (DRAPES) IMPLANT
DRAPE EXTREMITY T 121X128X90 (DRAPE) ×3 IMPLANT
DRAPE OEC MINIVIEW 54X84 (DRAPES) ×3 IMPLANT
DRAPE U 20/CS (DRAPES) ×3 IMPLANT
DRAPE U-SHAPE 47X51 STRL (DRAPES) ×3 IMPLANT
DRILL BIT 3.5X125 (BIT) ×3
DRSG EMULSION OIL 3X3 NADH (GAUZE/BANDAGES/DRESSINGS) ×3 IMPLANT
ELECT REM PT RETURN 9FT ADLT (ELECTROSURGICAL) ×3
ELECTRODE REM PT RTRN 9FT ADLT (ELECTROSURGICAL) ×1 IMPLANT
GAUZE SPONGE 4X4 12PLY STRL (GAUZE/BANDAGES/DRESSINGS) ×3 IMPLANT
GLOVE BIO SURGEON STRL SZ 6.5 (GLOVE) ×1 IMPLANT
GLOVE BIO SURGEON STRL SZ7.5 (GLOVE) ×3 IMPLANT
GLOVE BIO SURGEON STRL SZ8 (GLOVE) ×2 IMPLANT
GLOVE BIO SURGEONS STRL SZ 6.5 (GLOVE) ×1
GLOVE BIOGEL PI IND STRL 7.0 (GLOVE) IMPLANT
GLOVE BIOGEL PI IND STRL 8 (GLOVE) ×1 IMPLANT
GLOVE BIOGEL PI INDICATOR 7.0 (GLOVE) ×2
GLOVE BIOGEL PI INDICATOR 8 (GLOVE) ×4
GLOVE EXAM NITRILE MD LF STRL (GLOVE) ×2 IMPLANT
GOWN STRL REUS W/ TWL LRG LVL3 (GOWN DISPOSABLE) ×4 IMPLANT
GOWN STRL REUS W/ TWL XL LVL3 (GOWN DISPOSABLE) ×1 IMPLANT
GOWN STRL REUS W/TWL LRG LVL3 (GOWN DISPOSABLE) ×6
GOWN STRL REUS W/TWL XL LVL3 (GOWN DISPOSABLE) ×3
NEEDLE HYPO 22GX1.5 SAFETY (NEEDLE) IMPLANT
NS IRRIG 1000ML POUR BTL (IV SOLUTION) ×3 IMPLANT
PACK BASIN DAY SURGERY FS (CUSTOM PROCEDURE TRAY) ×3 IMPLANT
PAD CAST 4YDX4 CTTN HI CHSV (CAST SUPPLIES) ×1 IMPLANT
PADDING CAST COTTON 4X4 STRL (CAST SUPPLIES) ×3
PADDING CAST COTTON 6X4 STRL (CAST SUPPLIES) ×3 IMPLANT
PENCIL BUTTON HOLSTER BLD 10FT (ELECTRODE) ×3 IMPLANT
PLATE TUBUAL 1/3 6H (Plate) ×2 IMPLANT
REPAIR TROPE KNTLS SS SYNDESMO (Orthopedic Implant) ×2 IMPLANT
RETRIEVER SUT HEWSON (MISCELLANEOUS) ×2 IMPLANT
SCREW CANC FT 16X4X2.5XHEX (Screw) IMPLANT
SCREW CANCELLOUS 4.0X16MM (Screw) ×3 IMPLANT
SCREW CANCELLOUS FT 4.0X18MM (Screw) ×2 IMPLANT
SCREW CORTEX ST MATTA 3.5X14 (Screw) ×2 IMPLANT
SCREW CORTEX ST MATTA 3.5X16MM (Screw) ×4 IMPLANT
SCREW CORTEX ST MATTA 3.5X20 (Screw) ×2 IMPLANT
SLEEVE SCD COMPRESS KNEE MED (MISCELLANEOUS) ×2 IMPLANT
SPLINT FAST PLASTER 5X30 (CAST SUPPLIES)
SPLINT PLASTER CAST FAST 5X30 (CAST SUPPLIES) IMPLANT
SPONGE LAP 4X18 X RAY DECT (DISPOSABLE) ×3 IMPLANT
SUCTION FRAZIER TIP 10 FR DISP (SUCTIONS) IMPLANT
SUT MON AB 2-0 CT1 36 (SUTURE) ×3 IMPLANT
SUT MON AB 4-0 PC3 18 (SUTURE) ×1 IMPLANT
SUT VIC AB 0 SH 27 (SUTURE) ×2 IMPLANT
SUT VIC AB 2-0 SH 27 (SUTURE)
SUT VIC AB 2-0 SH 27XBRD (SUTURE) IMPLANT
SYR 20CC LL (SYRINGE) ×1 IMPLANT
SYR BULB 3OZ (MISCELLANEOUS) ×3 IMPLANT
TOWEL OR 17X24 6PK STRL BLUE (TOWEL DISPOSABLE) ×6 IMPLANT
TOWEL OR NON WOVEN STRL DISP B (DISPOSABLE) ×3 IMPLANT
TUBE CONNECTING 20'X1/4 (TUBING) ×1
TUBE CONNECTING 20X1/4 (TUBING) ×2 IMPLANT
UNDERPAD 30X30 INCONTINENT (UNDERPADS AND DIAPERS) ×3 IMPLANT

## 2014-09-11 NOTE — Anesthesia Preprocedure Evaluation (Signed)
Anesthesia Evaluation  Patient identified by MRN, date of birth, ID band Patient awake    Reviewed: Allergy & Precautions, H&P , NPO status , Patient's Chart, lab work & pertinent test results  Airway Mallampati: I  TM Distance: >3 FB Neck ROM: Full    Dental  (+) Teeth Intact, Dental Advisory Given   Pulmonary Current Smoker,  breath sounds clear to auscultation        Cardiovascular hypertension, Pt. on medications Rhythm:Regular Rate:Normal     Neuro/Psych    GI/Hepatic GERD-  Medicated and Controlled,(+) Hepatitis -, C  Endo/Other    Renal/GU      Musculoskeletal   Abdominal   Peds  Hematology   Anesthesia Other Findings   Reproductive/Obstetrics                             Anesthesia Physical Anesthesia Plan  ASA: II  Anesthesia Plan: General   Post-op Pain Management:    Induction: Intravenous  Airway Management Planned: LMA  Additional Equipment:   Intra-op Plan:   Post-operative Plan: Extubation in OR  Informed Consent: I have reviewed the patients History and Physical, chart, labs and discussed the procedure including the risks, benefits and alternatives for the proposed anesthesia with the patient or authorized representative who has indicated his/her understanding and acceptance.   Dental advisory given  Plan Discussed with: CRNA, Anesthesiologist and Surgeon  Anesthesia Plan Comments:         Anesthesia Quick Evaluation

## 2014-09-11 NOTE — Op Note (Signed)
09/11/2014  12:54 PM  PATIENT:  Dean Davis    PRE-OPERATIVE DIAGNOSIS:  Displaced fracture of lateral malleolus of left fibula, initial encounter for closed fracture, other fracture of left lower leg  POST-OPERATIVE DIAGNOSIS:  Same  PROCEDURE:  LEFT FRACTURE OPEN TREATMENT DISTAL FIBULAR (LATERAL MALLEOLUS) INCLUDES INTERNAL FIXATION, FRACTURE OPEN TREATMENT DISTAL TIBIOFIBULAR INCLUDES INTERAL FIXATION  SURGEON:  Armine Rizzolo, D, MD  ASSISTANT: Janace LittenBrandon Parry, OPA, He was necessary for efficiency and safety of the case.   ANESTHESIA:   Gen + block  PREOPERATIVE INDICATIONS:  Dean Davis is a  53 y.o. male with a diagnosis of Displaced fracture of lateral malleolus of left fibula, initial encounter for closed fracture, other fracture of left lower leg who failed conservative measures and elected for surgical management.    The risks benefits and alternatives were discussed with the patient preoperatively including but not limited to the risks of infection, bleeding, nerve injury, cardiopulmonary complications, the need for revision surgery, among others, and the patient was willing to proceed.  OPERATIVE IMPLANTS: 1/3 tubular plate and a stainless tight rope  OPERATIVE FINDINGS: Unstable ankle fracture. Stable syndesmosis post op  BLOOD LOSS: min  COMPLICATIONS: none  TOURNIQUET TIME: 45min  OPERATIVE PROCEDURE:  Patient was identified in the preoperative holding area and site was marked by me He was transported to the operating theater and placed on the table in supine position taking care to pad all bony prominences. After a preincinduction time out anesthesia was induced. The left lower extremity was prepped and draped in normal sterile fashion and a pre-incision timeout was performed. [facility] D[facility] received ancef for preoperative antibiotics.   I made a lateral incision of roughly 7 cm dissection was carried down sharply to the distal fibula and then spreading dissection was used  proximally to protect the superficial peroneal nerve. I sharply incised the periosteum and took care to protect the peroneal tendons. I then debrided the fracture site and performed a reduction maneuver which was held in place with a clamp.   I placed a lag screw across the fracture  I then selected a 6-hole one third tubular plate and placed in a neutralization fashion care was taken distally so as not to penetrate the joint with the cancellus screws.  I then stressed the syndesmosis and it was unstable for syndesmotic fixation I performed a reduction maneuver with a clamp and placed a stainless tight rope. The ankle was stable after  The wound was then thoroughly irrigated and closed using a 0 Vicryl and absorbable Monocryl sutures. He was placed in a short leg splint.   POST OPERATIVE PLAN: Non-weightbearing. DVT prophylaxis will consist of early mobilization and ASA 81  This note was generated using a template and dragon dictation system. In light of that, I have reviewed the note and all aspects of it are applicable to this case. Any dictation errors are due to the computerized dictation system.

## 2014-09-11 NOTE — Interval H&P Note (Signed)
History and Physical Interval Note:  09/11/2014 11:52 AM  Dean Davis  has presented today for surgery, with the diagnosis of displaced fracture of lateral malleolus of left fibula, initial encounter for closed fracture, other fracture of left lower leg  The various methods of treatment have been discussed with the patient and family. After consideration of risks, benefits and other options for treatment, the patient has consented to  Procedure(s): LEFT FRACTURE OPEN TREATMENT DISTAL FIBULAR (LATERAL MALLEOLUS) INCLUDES INTERNAL FIXATION, FRACTURE OPEN TREATMENT DISTAL TIBIOFIBULAR INCLUDES INTERAL FIXATION (Left) as a surgical intervention .  The patient's history has been reviewed, patient examined, no change in status, stable for surgery.  I have reviewed the patient's chart and labs.  Questions were answered to the patient's satisfaction.     Tiarah Shisler, D

## 2014-09-11 NOTE — Discharge Instructions (Signed)
No weight on your left leg  Keep your dressings intact and dry   Post Anesthesia Home Care Instructions  Activity: Get plenty of rest for the remainder of the day. A responsible adult should stay with you for 24 hours following the procedure.  For the next 24 hours, DO NOT: -Drive a car -Advertising copywriterperate machinery -Drink alcoholic beverages -Take any medication unless instructed by your physician -Make any legal decisions or sign important papers.  Meals: Start with liquid foods such as gelatin or soup. Progress to regular foods as tolerated. Avoid greasy, spicy, heavy foods. If nausea and/or vomiting occur, drink only clear liquids until the nausea and/or vomiting subsides. Call your physician if vomiting continues.  Special Instructions/Symptoms: Your throat may feel dry or sore from the anesthesia or the breathing tube placed in your throat during surgery. If this causes discomfort, gargle with warm salt water. The discomfort should disappear within 24 hours.   Regional Anesthesia Blocks  1. Numbness or the inability to move the "blocked" extremity may last from 3-48 hours after placement. The length of time depends on the medication injected and your individual response to the medication. If the numbness is not going away after 48 hours, call your surgeon.  2. The extremity that is blocked will need to be protected until the numbness is gone and the  Strength has returned. Because you cannot feel it, you will need to take extra care to avoid injury. Because it may be weak, you may have difficulty moving it or using it. You may not know what position it is in without looking at it while the block is in effect.  3. For blocks in the legs and feet, returning to weight bearing and walking needs to be done carefully. You will need to wait until the numbness is entirely gone and the strength has returned. You should be able to move your leg and foot normally before you try and bear weight or walk.  You will need someone to be with you when you first try to ensure you do not fall and possibly risk injury.  4. Bruising and tenderness at the needle site are common side effects and will resolve in a few days.  5. Persistent numbness or new problems with movement should be communicated to the surgeon or the [facility] [phone])/ [facility] [phone]).

## 2014-09-11 NOTE — Anesthesia Postprocedure Evaluation (Signed)
  Anesthesia Post-op Note  Patient: Dean Davis  Procedure(s) Performed: Procedure(s): LEFT FRACTURE OPEN TREATMENT DISTAL FIBULAR (LATERAL MALLEOLUS) INCLUDES INTERNAL FIXATION, FRACTURE OPEN TREATMENT DISTAL TIBIOFIBULAR INCLUDES INTERAL FIXATION (Left)  Patient Location: PACU  Anesthesia Type: General with regional for post op pain   Level of Consciousness: awake, alert  and oriented  Airway and Oxygen Therapy: Patient Spontanous Breathing  Post-op Pain: none  Post-op Assessment: Post-op Vital signs reviewed  Post-op Vital Signs: Reviewed  Last Vitals:  Filed Vitals:   09/11/14 1430  BP: 124/88  Pulse: 63  Temp:   Resp: 12    Complications: No apparent anesthesia complications

## 2014-09-11 NOTE — Progress Notes (Signed)
Assisted Dr. Crews with left, ultrasound guided, popliteal/saphenous block. Side rails up, monitors on throughout procedure. See vital signs in flow sheet. Tolerated Procedure well. 

## 2014-09-11 NOTE — Transfer of Care (Signed)
Immediate Anesthesia Transfer of Care Note  [facility]  Procedure(s) Performed: Procedure(s): LEFT FRACTURE OPEN TREATMENT DISTAL FIBULAR (LATERAL MALLEOLUS) INCLUDES INTERNAL FIXATION, FRACTURE OPEN TREATMENT DISTAL TIBIOFIBULAR INCLUDES INTERAL FIXATION (Left)  Patient Location: PACU  Anesthesia Type:GA combined with regional for post-op pain  Level of Consciousness: sedated  Airway & Oxygen Therapy: Patient Spontanous Breathing and Patient connected to face mask oxygen  Post-op Assessment: Report given to PACU RN and Post -op Vital signs reviewed and stable  Post vital signs: Reviewed and stable  Complications: No apparent anesthesia complications

## 2014-09-11 NOTE — Anesthesia Procedure Notes (Addendum)
Anesthesia Regional Block:  Popliteal block  Pre-Anesthetic Checklist: ,, timeout performed, Correct Patient, Correct Site, Correct Laterality, Correct Procedure, Correct Position, site marked, Risks and benefits discussed,  Surgical consent,  Pre-op evaluation,  At surgeon's request and post-op pain management  Laterality: Left and Lower  Prep: chloraprep       Needles:  Injection technique: Single-shot  Needle Type: Echogenic Needle     Needle Length: 9cm 9 cm Needle Gauge: 21 and 21 G    Additional Needles:  Procedures: ultrasound guided (picture in chart) Popliteal block Narrative:  Start time: 09/11/2014 11:09 AM End time: 09/11/2014 11:14 AM Injection made incrementally with aspirations every 5 mL.  Performed by: Personally  Anesthesiologist: CREWS, DAVID A   Anesthesia Regional Block:  Adductor canal block  Pre-Anesthetic Checklist: ,, timeout performed, Correct Patient, Correct Site, Correct Laterality, Correct Procedure, Correct Position, site marked, Risks and benefits discussed,  Surgical consent,  Pre-op evaluation,  At surgeon's request and post-op pain management  Laterality: Left and Lower  Prep: chloraprep       Needles:  Injection technique: Single-shot  Needle Type: Echogenic Needle     Needle Length: 9cm 9 cm Needle Gauge: 21 and 21 G    Additional Needles:  Procedures: ultrasound guided (picture in chart) Adductor canal block Narrative:  Start time: 09/11/2014 11:15 AM End time: 09/11/2014 11:18 AM Injection made incrementally with aspirations every 5 mL.  Performed by: Personally  Anesthesiologist: CREWS, DAVID A   Procedure Name: LMA Insertion Date/Time: 09/11/2014 12:06 PM Performed by: Burna CashONRAD, Trecia Maring C Pre-anesthesia Checklist: Patient identified, Emergency Drugs available, Suction available and Patient being monitored Patient Re-evaluated:Patient Re-evaluated prior to inductionOxygen Delivery Method: Circle System  Utilized Preoxygenation: Pre-oxygenation with 100% oxygen Intubation Type: IV induction Ventilation: Mask ventilation without difficulty LMA: LMA inserted LMA Size: 5.0 Number of attempts: 1 Airway Equipment and Method: bite block Placement Confirmation: positive ETCO2 Tube secured with: Tape Dental Injury: Teeth and Oropharynx as per pre-operative assessment

## 2014-09-12 ENCOUNTER — Encounter (HOSPITAL_BASED_OUTPATIENT_CLINIC_OR_DEPARTMENT_OTHER): Payer: Self-pay | Admitting: Orthopedic Surgery
